# Patient Record
Sex: Female | Born: 1965 | Race: White | Hispanic: No | Marital: Single | Smoking: Never smoker
Health system: Southern US, Community
[De-identification: ages and names within clinical notes are randomized; demographics above are authoritative.]

## PROBLEM LIST (undated history)

## (undated) DIAGNOSIS — K625 Hemorrhage of anus and rectum: Secondary | ICD-10-CM

## (undated) DIAGNOSIS — F419 Anxiety disorder, unspecified: Secondary | ICD-10-CM

## (undated) DIAGNOSIS — E041 Nontoxic single thyroid nodule: Secondary | ICD-10-CM

## (undated) DIAGNOSIS — Z8711 Personal history of peptic ulcer disease: Secondary | ICD-10-CM

## (undated) DIAGNOSIS — M545 Low back pain, unspecified: Secondary | ICD-10-CM

## (undated) DIAGNOSIS — Z8742 Personal history of other diseases of the female genital tract: Secondary | ICD-10-CM

## (undated) DIAGNOSIS — E785 Hyperlipidemia, unspecified: Secondary | ICD-10-CM

## (undated) DIAGNOSIS — B019 Varicella without complication: Secondary | ICD-10-CM

## (undated) HISTORY — DX: Low back pain: M54.5

## (undated) HISTORY — DX: Anxiety disorder, unspecified: F41.9

## (undated) HISTORY — DX: Personal history of peptic ulcer disease: Z87.11

## (undated) HISTORY — DX: Hemorrhage of anus and rectum: K62.5

## (undated) HISTORY — DX: Nontoxic single thyroid nodule: E04.1

## (undated) HISTORY — DX: Varicella without complication: B01.9

## (undated) HISTORY — DX: Hyperlipidemia, unspecified: E78.5

## (undated) HISTORY — PX: OTHER SURGICAL HISTORY: SHX169

## (undated) HISTORY — DX: Low back pain, unspecified: M54.50

## (undated) HISTORY — DX: Personal history of other diseases of the female genital tract: Z87.42

---

## 2013-06-26 LAB — LIPID PANEL: LDL CALC: 142 mg/dL

## 2013-07-03 LAB — LIPID PANEL
CHOLESTEROL: 214 mg/dL — AB (ref 0–200)
HDL: 53 mg/dL (ref 35–70)
Triglycerides: 97 mg/dL (ref 40–160)

## 2013-07-03 LAB — CBC AND DIFFERENTIAL
HEMATOCRIT: 46 % (ref 36–46)
Hemoglobin: 15.1 g/dL (ref 12.0–16.0)
NEUTROS ABS: 54 /uL
Platelets: 313 10*3/uL (ref 150–399)
WBC: 6.6 10*3/mL

## 2013-07-03 LAB — HEMOGLOBIN A1C: Hemoglobin A1C: 5.5

## 2013-07-03 LAB — TSH: TSH: 2.18 u[IU]/mL (ref <=5.90)

## 2014-12-24 LAB — HM COLONOSCOPY

## 2015-07-12 LAB — HM MAMMOGRAPHY

## 2016-03-30 ENCOUNTER — Ambulatory Visit (INDEPENDENT_AMBULATORY_CARE_PROVIDER_SITE_OTHER): Payer: Commercial Managed Care - PPO | Admitting: Family Medicine

## 2016-03-30 ENCOUNTER — Encounter: Payer: Self-pay | Admitting: Family Medicine

## 2016-03-30 VITALS — BP 124/90 | HR 92 | Temp 98.9°F | Wt 192.6 lb

## 2016-03-30 DIAGNOSIS — J0110 Acute frontal sinusitis, unspecified: Secondary | ICD-10-CM | POA: Diagnosis not present

## 2016-03-30 DIAGNOSIS — E041 Nontoxic single thyroid nodule: Secondary | ICD-10-CM | POA: Diagnosis not present

## 2016-03-30 HISTORY — DX: Nontoxic single thyroid nodule: E04.1

## 2016-03-30 MED ORDER — BENZONATATE 100 MG PO CAPS: 100.0000 mg | ORAL_CAPSULE | Freq: Two times a day (BID) | ORAL | 0 refills | Status: DC | PRN

## 2016-03-30 MED ORDER — AMOXICILLIN-POT CLAVULANATE 875-125 MG PO TABS: 1.0000 | ORAL_TABLET | Freq: Two times a day (BID) | ORAL | 0 refills | Status: DC

## 2016-03-30 NOTE — Progress Notes (Signed)
Pre visit review using our clinic review tool, if applicable. No additional management support is needed unless otherwise documented below in the visit note. 

## 2016-03-30 NOTE — Patient Instructions (Addendum)
Sinsusitis Viral based on <10 days, no double sickening (symptoms get beter than worsen), lack of severity of symptoms in first 3 days. Educated on signs that bacterial infection may have developed (symptoms over 10 days, double sickening).   As we will be going into a long holiday weekend- we opted to provide augmentin in case [name] is not at least 50% better by Friday or Saturday. Also can use tessalon for cough. Noted recent foreign travel.   We reviewed reasons to return to care including if symptoms worsen or persist or new concerns arise (particularly fever or shortness of breath)  Bottom blood pressure hair high- will monitor- any history?  Sign release of information at the check out desk for records for last 5 years from your prior primary care doctor  ______________________________________________________________________  Starting October 1st 2018, I will be transferring to our new location: [place] [address]- across [place] from [employer] [employer] [place]) [place] [id number] Phone: [phone]  I would love to have you remain my patient at this new location as long as it remains convenient for you. I am excited about the opportunity to have x-ray and sports medicine in the new building but will really miss the awesome staff and physicians at [facility]. Continue to schedule appointments at [facility] and we will automatically transfer them to the [facility] [place] [facility] location starting October 1st.    __________________________________________________________________      WE NOW OFFER    [facility] FAST TRACK!!!  SAME DAY Appointments for ACUTE CARE  Such as: Sprains, Injuries, cuts, abrasions, rashes, muscle pain, joint pain, back pain Colds, flu, sore throats, headache, allergies, cough, fever  Ear pain, sinus and eye infections Abdominal pain, nausea, vomiting, diarrhea, upset  stomach Animal/insect bites  3 Easy Ways to Schedule: Walk-In Scheduling Call in scheduling Mychart Sign-up: [email or web address]

## 2016-03-30 NOTE — Progress Notes (Signed)
Phone: [phone]  Subjective:  Patient presents today to establish care.  Prior patient in [place]. Chief complaint-noted.   See problem oriented charting  The following were reviewed and entered/updated in epic: Past Medical History:  Diagnosis Date  . Chicken pox   . Thyroid nodule 03/30/2016   Need to get records. Gets scan every other year   Patient Active Problem List   Diagnosis Date Noted  . Thyroid nodule 03/30/2016   Past Surgical History:  Procedure Laterality Date  . none      Family History  Problem Relation Age of Onset  . Lung cancer Mother     smoker  . CAD Father     cabg x4 at age 53  . Hyperlipidemia Father   . Healthy Brother   . Breast cancer Maternal Aunt     Medications- reviewed and updated Current Outpatient Prescriptions  Medication Sig Dispense Refill  . amoxicillin-clavulanate (AUGMENTIN) 875-125 MG tablet Take 1 tablet by mouth 2 (two) times daily. 14 tablet 0  . benzonatate (TESSALON) 100 MG capsule Take 1 capsule (100 mg total) by mouth 2 (two) times daily as needed for cough. 20 capsule 0   No current facility-administered medications for this visit.     Allergies-reviewed and updated Allergies not on file  Social History   Social History  . Marital status: Single    Spouse name: N/A  . Number of children: N/A  . Years of education: N/A   Social History Main Topics  . Smoking status: Never Smoker  . Smokeless tobacco: Never Used  . Alcohol use 0.0 - 0.6 oz/week     Comment: rare social  . Drug use: No  . Sexual activity: Not Asked   Other Topics Concern  . None   Social History Narrative   Single. Lives alone. 1 dog- boxer. No children.       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       Hobbies: tennis, travel ([place]ach, [place], [place], [place]    ROS--Full ROS was completed Review of Systems  Constitutional: Positive for malaise/fatigue. Negative for chills and fever.  HENT:  Positive for congestion and sinus pain. Negative for ear pain, hearing loss and tinnitus.   Eyes: Negative for blurred vision and double vision.  Respiratory: Positive for cough and sputum production. Negative for hemoptysis, shortness of breath and wheezing.   Cardiovascular: Negative for chest pain and palpitations.  Gastrointestinal: Negative for heartburn and nausea.  Genitourinary: Negative for dysuria and urgency.  Musculoskeletal: Negative for myalgias and neck pain.  Skin: Negative for itching and rash.  Neurological: Negative for dizziness and headaches.  Endo/Heme/Allergies: Negative for polydipsia. Does not bruise/bleed easily.  Psychiatric/Behavioral: Negative for hallucinations and substance abuse.   Objective: BP 124/90 (BP Location: Left Arm, Patient Position: Sitting, Cuff Size: Normal)   Pulse 92   Temp 98.9 F (37.2 C) (Oral)   Wt 192 lb 9.6 oz (87.4 kg)   SpO2 97%  Gen: NAD, resting comfortably, intermittent cough HEENT: Turbinates erythematous with yellow drainage, TM normal, pharynx mildly erythematous with no tonsilar exudate or edema, maxillary bilateral sinus tenderness, frontal sinus tenderness more severe Eyes: sclera and lids normal, PERRLA Neck:  no cervical lymphadenopathy CV: RRR no murmurs rubs or gallops Lungs: CTAB no crackles, wheeze, rhonchi Abdomen: soft/nontender/nondistended/normal bowel sounds. No rebound or guarding.  Ext: no edema Skin: warm, dry Neuro: 5/5 strength in upper and lower extremities, normal gait, normal reflexes   Assessment/Plan:  Cough, congestion, sinus pressure S: was in [place] last week and started with cough and runny nose, sneezing. No fever or shortness of breath. Does have sinus tenderness particularly frontal area. Mild sore throat. Day 6 today. Has taken nyquil liquid gels and not sure if helping other than nose drying up. Was in [place] from Monday to Saturday of last week so sick in middle of this. There were some MERS  warnings. Did used to get sinus infections before she moved.  A/P: Sinsusitis Viral based on <10 days, no double sickening (symptoms get beter than worsen), lack of severity of symptoms in first 3 days. Educated on signs that bacterial infection may have developed (symptoms over 10 days, double sickening).   As we will be going into a long holiday weekend- we opted to provide augmentin in case Ms. Lunden is not at least 50% better by Friday or Saturday. Also can use tessalon for cough. Noted recent foreign travel.   We reviewed reasons to return to care including if symptoms worsen or persist or new concerns arise (particularly fever or shortness of breath)  Thyroid nodule S: history of thyroid nodule monitored q 2 years A/P: will get records and determine next follow up   Meds ordered this encounter  Medications  . benzonatate (TESSALON) 100 MG capsule    Sig: Take 1 capsule (100 mg total) by mouth 2 (two) times daily as needed for cough.    Dispense:  20 capsule    Refill:  0  . amoxicillin-clavulanate (AUGMENTIN) 875-125 MG tablet    Sig: Take 1 tablet by mouth 2 (two) times daily.    Dispense:  14 tablet    Refill:  0   Return precautions advised. Tana ConchStephen Hunter, MD

## 2016-04-17 ENCOUNTER — Encounter: Payer: Self-pay | Admitting: Family Medicine

## 2016-04-17 DIAGNOSIS — J302 Other seasonal allergic rhinitis: Secondary | ICD-10-CM | POA: Insufficient documentation

## 2016-04-17 DIAGNOSIS — E041 Nontoxic single thyroid nodule: Secondary | ICD-10-CM

## 2016-04-17 DIAGNOSIS — J301 Allergic rhinitis due to pollen: Secondary | ICD-10-CM

## 2016-04-17 NOTE — Progress Notes (Unsigned)
Colonoscopy was noted December 2016- internal hemorrhoids only- will be abstracted  Constipation issues in the past- miralax to linzess potential change.   Xanax in the past for flying  2013 abnormal menstrual periods- will need to discuss if had end resolution- last notes from 2012.   Abstract labs 07/21/15. hgb hair high at 15.1a1c 5.5LDL 142  Mammogram 07/12/15 reproted as normal. Unclear- may need to request clearer copy from [facility] health  Low back pain with MRI 07/10/14 scanned in. Mild degenerative changes  [place] report scanned in

## 2016-04-17 NOTE — Assessment & Plan Note (Addendum)
Records reviewed. Patient with u/s dec 2012 showing multinodular goiter. Repeat 06/2013 stable subcentimeter nodules. Repeat 07/12/15 "normal thyroid gland with small colloid nodules" size 3.3 cm right lobe and 2.5 cm lefft lobe. "several cystic nodules all less than 6 mm"

## 2016-04-29 ENCOUNTER — Encounter: Payer: Self-pay | Admitting: Family Medicine

## 2016-05-28 ENCOUNTER — Encounter: Payer: Self-pay | Admitting: Family Medicine

## 2016-06-04 ENCOUNTER — Ambulatory Visit (INDEPENDENT_AMBULATORY_CARE_PROVIDER_SITE_OTHER)
Admission: RE | Admit: 2016-06-04 | Discharge: 2016-06-04 | Disposition: A | Discharge status: Home / Self Care | Payer: Commercial Managed Care - PPO | Source: Ambulatory Visit | Attending: Family Medicine | Admitting: Family Medicine

## 2016-06-04 ENCOUNTER — Ambulatory Visit (INDEPENDENT_AMBULATORY_CARE_PROVIDER_SITE_OTHER): Payer: Commercial Managed Care - PPO | Admitting: Family Medicine

## 2016-06-04 ENCOUNTER — Encounter: Payer: Self-pay | Admitting: Family Medicine

## 2016-06-04 VITALS — BP 100/68 | HR 88 | Temp 98.4°F | Wt 202.0 lb

## 2016-06-04 DIAGNOSIS — M79675 Pain in left toe(s): Secondary | ICD-10-CM

## 2016-06-04 NOTE — Patient Instructions (Addendum)
It was a pleasure to see you today.  Please go to [facility] - located [address] across the street from [place] - in the basement - Hours: 8:30-5:30 PM M-F. Do not need appointment.    Toe Fracture A toe fracture is a break in one of the toe bones (phalanges). Follow these instructions at home: If you have a cast:  Do not stick anything inside the cast to scratch your skin.  Check the skin around the cast every day. Tell your doctor about any concerns. Do not put lotion on the skin underneath the cast. You may put lotion on dry skin around the edges of the cast.  Do not put pressure on any part of the cast until it is fully hardened. This may take many hours.  Keep the cast clean and dry. Bathing  Do not take baths, swim, or use a hot tub until your doctor says that you can. Ask your doctor if you can take showers. You may only be allowed to take sponge baths for bathing.  If your doctor says that bathing and showering are okay, cover the cast or bandage (dressing) with a watertight plastic bag to protect it from water. Do not let the cast or bandage get wet. Managing pain, stiffness, and swelling  If you do not have a cast, put ice on the injured area if told by your doctor: ? Put ice in a plastic bag. ? Place a towel between your skin and the bag. ? Leave the ice on for 20 minutes, 2-3 times per day.  Move your toes often to avoid stiffness and to lessen swelling.  Raise (elevate) the injured area above the level of your heart while you are sitting or lying down. Driving  Do not drive or use heavy machinery while taking pain medicine.  Do not drive while wearing a cast on a foot that you use for driving. Activity  Return to your normal activities as told by your doctor. Ask your doctor what activities are safe for you.  Perform exercises daily as told by your doctor or therapist. Safety  Do not use your leg to support your body weight until your doctor  says that you can. Use crutches or other tools to help you move around as told by your doctor. General instructions  If your toe was taped to a toe that is next to it (buddy taping), follow your doctor's instructions for changing the gauze and tape. Change it more often: ? If the gauze and tape get wet. If this happens, dry the space between the toes. ? If the gauze and tape are too tight and they cause your toe to become pale or to lose feeling (numb).  Wear a protective shoe as told by your doctor. If you were not given one, wear sturdy shoes that support your foot. Your shoes should not pinch your toes. Your shoes should not fit tightly against your toes.  Do not use any tobacco products, including cigarettes, chewing tobacco, or e-cigarettes. Tobacco can delay bone healing. If you need help quitting, ask your doctor.  Take medicines only as told by your doctor.  Keep all follow-up visits as told by your doctor. This is important. Contact a doctor if:  You have a fever.  Your pain medicine is not helping.  Your toe feels cold.  You lose feeling (have numbness) in your toe.  You still have pain after one week of rest and treatment.  You still have pain after your doctor has said that you can start walking again.  You have pain or tingling in your foot, and it is not going away.  You have loss of feeling in your foot, and it is not going away. Get help right away if:  You have severe pain.  You have redness or swelling (inflammation) in your toe, and it is getting worse.  You have pain or loss of feeling in your toe, and it is getting worse.  Your toe is blue. This information is not intended to replace advice given to you by your health care provider. Make sure you discuss any questions you have with your health care provider. Document Released: 06/10/2007 Document Revised: 08/26/2015 Document Reviewed: 10/18/2013 Elsevier Interactive Patient Education  2018 Elsevier  Inc.     WE NOW OFFER   [facility] FAST TRACK!!!  SAME DAY Appointments for ACUTE CARE  Such as: Sprains, Injuries, cuts, abrasions, rashes, muscle pain, joint pain, back pain Colds, flu, sore throats, headache, allergies, cough, fever  Ear pain, sinus and eye infections Abdominal pain, nausea, vomiting, diarrhea, upset stomach Animal/insect bites  3 Easy Ways to Schedule: Walk-In Scheduling Call in scheduling Mychart Sign-up: [email or web address]

## 2016-06-04 NOTE — Progress Notes (Signed)
Subjective:    Patient ID: Pamela Rice, female    DOB: [birth year], 51 y.o.   MRN: [id number]  HPI  Pamela Rice is a 51 year old female who presents today with pain of her 4th left toe that started 2 weeks ago after a trigger of "catching" her toe on the pantry door and she heard a "pop".  After injury occurred, associated ecchymosis and swelling were noted. Ecchymosis has resolved, mild swelling remains. Treatment with ice for 15 minutes and elevation once daily has provided limited benefit. She denies other treatment and states that pain is noted only with lateral movement. Pain is described a aching with lateral movement, however she has not used any OTC pain medications stating she does not need it now. She denies difficulty walking and has been wearing flat shoes where she notices swelling every evening. No associated numbness, tingling, or weakness.   Review of Systems  Constitutional: Negative for chills, fatigue and fever.  Respiratory: Negative for cough, shortness of breath and wheezing.   Cardiovascular: Negative for chest pain and palpitations.  Musculoskeletal:       Left 4th toe pain and swelling  Skin: Negative for color change and rash.   Past Medical History:  Diagnosis Date  . Chicken pox   . Low back pain    saw [place]o in [place] 2016  . Thyroid nodule 03/30/2016   Need to get records. Gets scan every other year     Social History   Social History  . Marital status: Single    Spouse name: N/A  . Number of children: N/A  . Years of education: N/A   Occupational History  . Not on file.   Social History Main Topics  . Smoking status: Never Smoker  . Smokeless tobacco: Never Used  . Alcohol use 0.0 - 0.6 oz/week     Comment: rare social  . Drug use: No  . Sexual activity: Not on file   Other Topics Concern  . Not on file   Social History Narrative   Single. Lives alone. 1 dog- boxer. No children.       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       Hobbies: tennis, travel ([place], [place], [place], [place]    Past Surgical History:  Procedure Laterality Date  . none      Family History  Problem Relation Age of Onset  . Lung cancer Mother        smoker  . CAD Father        cabg x4 at age 50  . Hyperlipidemia Father   . Healthy Brother   . Breast cancer Maternal Aunt     Allergies  Allergen Reactions  . Yeast-Related Products     No current outpatient prescriptions on file prior to visit.   No current facility-administered medications on file prior to visit.     BP 100/68 (BP Location: Left Arm, Patient Position: Sitting, Cuff Size: Large)   Pulse 88   Temp 98.4 F (36.9 C) (Oral)   Wt 202 lb (91.6 kg)   SpO2 98%        Objective:   Physical Exam  Constitutional: She is oriented to person, place, and time. She appears well-developed and well-nourished.  Eyes: Pupils are equal, round, and reactive to light. No scleral icterus.  Neck: Neck supple.  Cardiovascular: Normal rate, regular rhythm and intact distal pulses.   Pulses:      Dorsalis  pedis pulses are 2+ on the right side, and 2+ on the left side.       Posterior tibial pulses are 2+ on the right side, and 2+ on the left side.  Pulmonary/Chest: Effort normal and breath sounds normal. She has no wheezes. She has no rales.  Musculoskeletal:  Minimal edema noted on left 4th toe. No erythema or ecchymosis present. Discomfort with lateral movement. No deformity, disruption of nail bed, or point tenderness. Cap refill WNL,   Lymphadenopathy:    She has no cervical adenopathy.  Neurological: She is alert and oriented to person, place, and time.  Skin: Skin is warm and dry. No rash noted.       Assessment & Plan:  1. Toe pain, left Improving per patient; Suspect possible fracture due to history of injury; will obtain X-ray today; buddy tape toe and she can use either ibuprofen or acetaminophen for discomfort as needed. X-ray results  will determine follow up. Advised use of rigid soled shoe; she declined surgical shoe today. Written return precautions provided.  - DG Toe 4th Left; Future

## 2016-08-10 ENCOUNTER — Encounter: Payer: Self-pay | Admitting: Family Medicine

## 2016-08-10 DIAGNOSIS — E785 Hyperlipidemia, unspecified: Secondary | ICD-10-CM | POA: Insufficient documentation

## 2016-08-10 DIAGNOSIS — F419 Anxiety disorder, unspecified: Secondary | ICD-10-CM | POA: Insufficient documentation

## 2016-08-10 DIAGNOSIS — K625 Hemorrhage of anus and rectum: Secondary | ICD-10-CM | POA: Insufficient documentation

## 2016-08-10 DIAGNOSIS — M545 Low back pain, unspecified: Secondary | ICD-10-CM | POA: Insufficient documentation

## 2016-08-10 DIAGNOSIS — Z8742 Personal history of other diseases of the female genital tract: Secondary | ICD-10-CM | POA: Insufficient documentation

## 2016-08-15 ENCOUNTER — Telehealth: Payer: Self-pay | Admitting: Family Medicine

## 2016-08-15 NOTE — Telephone Encounter (Signed)
I have reviewed records  Appears she is due for evaluation of thyroid- lets have her schedule follow up with me so we can get a plan together.   I see where she saw GYn 12/2015 but no evidence of pap- can she request that pap report specifically please?

## 2016-08-17 NOTE — Telephone Encounter (Signed)
Scheduled patient for a physical on 09/03/16

## 2016-09-03 ENCOUNTER — Ambulatory Visit (INDEPENDENT_AMBULATORY_CARE_PROVIDER_SITE_OTHER): Payer: Commercial Managed Care - PPO | Admitting: Family Medicine

## 2016-09-03 ENCOUNTER — Encounter: Payer: Self-pay | Admitting: Family Medicine

## 2016-09-03 VITALS — BP 118/76 | HR 97 | Temp 98.1°F | Ht 64.5 in | Wt 204.2 lb

## 2016-09-03 DIAGNOSIS — Z0000 Encounter for general adult medical examination without abnormal findings: Secondary | ICD-10-CM

## 2016-09-03 DIAGNOSIS — E041 Nontoxic single thyroid nodule: Secondary | ICD-10-CM

## 2016-09-03 DIAGNOSIS — H8110 Benign paroxysmal vertigo, unspecified ear: Secondary | ICD-10-CM | POA: Diagnosis not present

## 2016-09-03 DIAGNOSIS — F419 Anxiety disorder, unspecified: Secondary | ICD-10-CM

## 2016-09-03 DIAGNOSIS — E785 Hyperlipidemia, unspecified: Secondary | ICD-10-CM

## 2016-09-03 DIAGNOSIS — M545 Low back pain: Secondary | ICD-10-CM | POA: Diagnosis not present

## 2016-09-03 DIAGNOSIS — Z23 Encounter for immunization: Secondary | ICD-10-CM | POA: Diagnosis not present

## 2016-09-03 MED ORDER — CYCLOBENZAPRINE HCL 10 MG PO TABS: 10.0000 mg | ORAL_TABLET | Freq: Two times a day (BID) | ORAL | 3 refills | Status: AC | PRN

## 2016-09-03 MED ORDER — MECLIZINE HCL 12.5 MG PO TABS: 12.5000 mg | ORAL_TABLET | Freq: Three times a day (TID) | ORAL | 2 refills | Status: AC | PRN

## 2016-09-03 MED ORDER — ALPRAZOLAM 0.25 MG PO TABS: 0.2500 mg | ORAL_TABLET | Freq: Every day | ORAL | 0 refills | Status: AC | PRN

## 2016-09-03 NOTE — Assessment & Plan Note (Signed)
Gets scan every other year. Records show 07/11/15 due for ultrasound and check TSH and T3. No thyroid ultrasound in report  - therefore we will get ultrasound, t3, t4, TSH today.

## 2016-09-03 NOTE — Addendum Note (Signed)
Addended by: Rivka SaferSOUTHERN HIZER, Asher MuirJAMIE M on: 09/03/2016 02:30 PM   Modules accepted: Orders

## 2016-09-03 NOTE — Assessment & Plan Note (Signed)
HLD- last #s from 2015, reviewed with patient and will update Lab Results  Component Value Date   CHOL 214 (A) 07/03/2013   HDL 53 07/03/2013   LDLCALC 142 06/26/2013   TRIG 97 07/03/2013

## 2016-09-03 NOTE — Assessment & Plan Note (Signed)
prior rx for xanax 2014 per prior PCP. Plane flights mainly. #10 xanax given for plane flights only

## 2016-09-03 NOTE — Patient Instructions (Addendum)
Tdap today  Sign release of information at the check out desk for pap smear from gynecology  Schedule a lab visit at the check out desk within 2 weeks. Return for future fasting labs meaning nothing but water after midnight please. Ok to take your medications with water.   We will call you within a week or two about your referral for ultrasound of thyroid. If you do not hear within 3 weeks, give us a call.

## 2016-09-03 NOTE — Assessment & Plan Note (Signed)
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 Asks for refill of meclizine- sent in for her.

## 2016-09-03 NOTE — Assessment & Plan Note (Signed)
MR 07/2014 mild degenerative disc bulge L4-5 and L5-SQ. facet arthropathy. celebrex prn in past.   States uses flexeril prn only at present- will refill

## 2016-09-03 NOTE — Progress Notes (Addendum)
Phone: [phone]  Subjective:  Patient presents today for their annual physical. Chief complaint-noted.   See problem oriented charting- ROS- full  review of systems was completed and negative except for: back pain at times- flexeril helps  The following were reviewed and entered/updated in epic: Past Medical History:  Diagnosis Date  . Anxiety    prior rx for xanax 2014 per prior PCP  . Chicken pox   . History of abnormal cervical Pap smear    Notes state history abnormal with + HPV but checked every 6 months until clear including HPV. Normal Pap and pelvic 12/2010.   Marland Kitchen History of peptic ulcer    also hiatal hernia/gastric polyp. EGD 2011- improved with improved diet, uctting soda, prn PPI  . Hyperlipidemia    no rx. from records  . Low back pain    saw ortho. MR 07/2014 mild degenerative disc bulge L4-5 and L5-SQ. facet arthropathy. celebrex prn  . Rectal bleeding    hemorrhoids. colonoscopy 12/2014 in pcp notes. advised miralax  . Thyroid nodule 03/30/2016   Need to get records. Gets scan every other year   Patient Active Problem List   Diagnosis Date Noted  . Anxiety     Priority: Medium  . Hyperlipidemia     Priority: Medium  . Low back pain     Priority: Medium  . Thyroid nodule 03/30/2016    Priority: Medium  . BPPV (benign paroxysmal positional vertigo) 09/03/2016    Priority: Low  . History of abnormal cervical Pap smear     Priority: Low  . Rectal bleeding     Priority: Low  . Seasonal allergies 04/17/2016    Priority: Low   Past Surgical History:  Procedure Laterality Date  . none      Family History  Problem Relation Age of Onset  . Lung cancer Mother        smoker  . CAD Father        cabg x4 at age 15  . Hyperlipidemia Father   . Healthy Brother   . Breast cancer Maternal Aunt     Medications- reviewed and updated Current Outpatient Prescriptions  Medication Sig Dispense Refill  . ALPRAZolam (XANAX) 0.25 MG tablet Take 1 tablet (0.25  mg total) by mouth daily as needed (flight anxiety). 10 tablet 0  . cyclobenzaprine (FLEXERIL) 10 MG tablet Take 1 tablet (10 mg total) by mouth 2 (two) times daily as needed for muscle spasms. 30 tablet 3  . meclizine (ANTIVERT) 12.5 MG tablet Take 1-2 tablets (12.5-25 mg total) by mouth 3 (three) times daily as needed for dizziness. 30 tablet 2   No current facility-administered medications for this visit.     Allergies-reviewed and updated Allergies  Allergen Reactions  . Yeast-Related Products     Social History   Social History  . Marital status: Single    Spouse name: N/A  . Number of children: N/A  . Years of education: N/A   Social History Main Topics  . Smoking status: Never Smoker  . Smokeless tobacco: Never Used  . Alcohol use 0.0 - 0.6 oz/week     Comment: rare social  . Drug use: No  . Sexual activity: Not Asked   Other Topics Concern  . None   Social History Narrative   Single. Lives alone. 1 dog- boxer. No children.       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       Hobbies: tennis, travel (  [place], [place], [place])    Objective: BP 118/76 (BP Location: Left Arm, Patient Position: Sitting, Cuff Size: Large)   Pulse 97   Temp 98.1 F (36.7 C) (Oral)   Ht 5' 4.5" (1.638 m)   Wt 204 lb 3.2 oz (92.6 kg)   SpO2 99%   BMI 34.51 kg/m  Gen: NAD, resting comfortably HEENT: Mucous membranes are moist. Oropharynx normal Neck: no thyromegaly CV: RRR no murmurs rubs or gallops Lungs: CTAB no crackles, wheeze, rhonchi Abdomen: soft/nontender/nondistended/normal bowel sounds. No rebound or guarding.  Ext: no edema Skin: warm, dry Neuro: grossly normal, moves all extremities, PERRLA  Assessment/Plan:  51 y.o. female presenting for annual physical.  Health Maintenance counseling: 1. Anticipatory guidance: Patient counseled regarding regular dental exams -goes to dentist at home, eye exams - still doing in [place], wearing  seatbelts.  2. Risk factor reduction:  Advised patient of need for regular exercise and diet rich and fruits and vegetables to reduce risk of heart attack and stroke. Exercise- getting ready to start again on exercise- had thrown her back out and trying to recover. Diet-focusing on more chicken than red meat. 1 serving of veggie per day, 0-1 of fruit. Almost nightly ice cream- try smaller scoop.   Wt Readings from Last 3 Encounters:  09/03/16 204 lb 3.2 oz (92.6 kg)  06/04/16 202 lb (91.6 kg)  03/30/16 192 lb 9.6 oz (87.4 kg)  3. Immunizations/screenings/ancillary studies- offered flu shot and declined. Tdap today offered and given.hold off on shingrix untial available. Declines HIV 4. Cervical cancer screening-  no record of pap. Saw GYN in [place] Dr. Elmore Guise in December- will get records. Abnormal pap smear in the past with + HPV- now on yearly schedule after clearance 5. Breast cancer screening-  breast exam with GYN and mammogram 07/12/15.  6. Colon cancer screening -  12/24/14 with 10 year follow up. Early due to rectal bleeding- no polyps on exam. 7. Skin cancer screening- [place] dermatology Dr. Charm Barges.   Status of chronic or acute concerns   Seasonal allergies- flonase and mucinex prn. Better in [place]  BPPV (benign paroxysmal positional vertigo) Bouts of vertigo thought BPPV in past. Asks for refill of meclizine- sent in for her.   Thyroid nodule Gets scan every other year. Records show 07/11/15 due for ultrasound and check TSH and T3. No thyroid ultrasound in report  - therefore we will get ultrasound, t3, t4, TSH today.   Hyperlipidemia HLD- last #s from 2015, reviewed with patient and will update Lab Results  Component Value Date   CHOL 214 (A) 07/03/2013   HDL 53 07/03/2013   LDLCALC 142 06/26/2013   TRIG 97 07/03/2013     Low back pain MR 07/2014 mild degenerative disc bulge L4-5 and L5-SQ. facet arthropathy. celebrex prn in past.   States uses flexeril prn  only at present- will refill  Anxiety prior rx for xanax 2014 per prior PCP. Plane flights mainly. #10 xanax given for plane flights only   No future appointments. No Follow-up on file.  Orders Placed This Encounter  Procedures  . US THYROID    Standing Status:   Future    Standing Expiration Date:   11/03/2017    Order Specific Question:   Reason for Exam (SYMPTOM  OR DIAGNOSIS REQUIRED)    Answer:   thyroid    Order Specific Question:   Preferred imaging location?    Answer:   [facility]  . CBC  [facility]    Standing Status:   Future    Standing Expiration Date:   09/03/2017  . Comprehensive metabolic panel    [facility]    Standing Status:   Future    Standing Expiration Date:   09/03/2017    Order Specific Question:   Has the patient fasted?    Answer:   No  . Lipid panel    [facility]    Standing Status:   Future    Standing Expiration Date:   09/03/2017    Order Specific Question:   Has the patient fasted?    Answer:   No  . TSH    [facility]    Standing Status:   Future    Standing Expiration Date:   09/03/2017  . T4, free    [facility]    Standing Status:   Future    Standing Expiration Date:   09/03/2017  . T3, free    Standing Status:   Future    Standing Expiration Date:   09/03/2017    Meds ordered this encounter  Medications  . cyclobenzaprine (FLEXERIL) 10 MG tablet    Sig: Take 1 tablet (10 mg total) by mouth 2 (two) times daily as needed for muscle spasms.    Dispense:  30 tablet    Refill:  3  . ALPRAZolam (XANAX) 0.25 MG tablet    Sig: Take 1 tablet (0.25 mg total) by mouth daily as needed (flight anxiety).    Dispense:  10 tablet    Refill:  0  . meclizine (ANTIVERT) 12.5 MG tablet    Sig: Take 1-2 tablets (12.5-25 mg total) by mouth 3 (three) times daily as needed for dizziness.    Dispense:  30 tablet    Refill:  2    Return precautions advised.  Tana ConchStephen Shailee Foots, MD

## 2016-09-04 ENCOUNTER — Other Ambulatory Visit (INDEPENDENT_AMBULATORY_CARE_PROVIDER_SITE_OTHER): Payer: Commercial Managed Care - PPO

## 2016-09-04 DIAGNOSIS — E785 Hyperlipidemia, unspecified: Secondary | ICD-10-CM

## 2016-09-04 DIAGNOSIS — Z0000 Encounter for general adult medical examination without abnormal findings: Secondary | ICD-10-CM

## 2016-09-04 DIAGNOSIS — E041 Nontoxic single thyroid nodule: Secondary | ICD-10-CM | POA: Diagnosis not present

## 2016-09-04 LAB — CBC
HEMATOCRIT: 44.2 % (ref 36.0–46.0)
HEMOGLOBIN: 14.7 g/dL (ref 12.0–15.0)
MCHC: 33.2 g/dL (ref 30.0–36.0)
MCV: 87.9 fl (ref 78.0–100.0)
PLATELETS: 356 10*3/uL (ref 150.0–400.0)
RBC: 5.03 Mil/uL (ref 3.87–5.11)
RDW: 14.6 % (ref 11.5–15.5)
WBC: 5.6 10*3/uL (ref 4.0–10.5)

## 2016-09-04 LAB — COMPREHENSIVE METABOLIC PANEL
ALT: 17 U/L (ref 0–35)
AST: 12 U/L (ref 0–37)
Albumin: 4.3 g/dL (ref 3.5–5.2)
Alkaline Phosphatase: 80 U/L (ref 39–117)
BUN: 12 mg/dL (ref 6–23)
CHLORIDE: 105 meq/L (ref 96–112)
CO2: 24 meq/L (ref 19–32)
Calcium: 9.5 mg/dL (ref 8.4–10.5)
Creatinine, Ser: 0.67 mg/dL (ref 0.40–1.20)
GFR: 98.87 mL/min (ref >60.00)
GLUCOSE: 98 mg/dL (ref 70–99)
POTASSIUM: 4.4 meq/L (ref 3.5–5.1)
Sodium: 139 mEq/L (ref 135–145)
Total Bilirubin: 0.3 mg/dL (ref 0.2–1.2)
Total Protein: 6.6 g/dL (ref 6.0–8.3)

## 2016-09-04 LAB — LIPID PANEL
CHOL/HDL RATIO: 4
Cholesterol: 222 mg/dL — ABNORMAL HIGH (ref 0–200)
HDL: 54.5 mg/dL (ref >39.00)
LDL CALC: 153 mg/dL — AB (ref 0–99)
NONHDL: 167.9
Triglycerides: 75 mg/dL (ref 0.0–149.0)
VLDL: 15 mg/dL (ref 0.0–40.0)

## 2016-09-04 LAB — T4, FREE: Free T4: 0.94 ng/dL (ref 0.60–1.60)

## 2016-09-04 LAB — T3, FREE: T3, Free: 3.7 pg/mL (ref 2.3–4.2)

## 2016-09-04 LAB — TSH: TSH: 2.7 u[IU]/mL (ref 0.35–4.50)

## 2016-09-09 ENCOUNTER — Telehealth: Payer: Self-pay | Admitting: *Deleted

## 2016-09-09 NOTE — Telephone Encounter (Signed)
Pt notified of instructions and verbalized understanding. Would you recommend she wear compression stockings now to help w/ swelling, or wear them only for flight? Thanks.

## 2016-09-09 NOTE — Telephone Encounter (Signed)
Injury sounds like trauma related and unlikely clot has caused her symptoms. She can seek care locally if concerned. Good sign this is improving  If this is not clot related- having traumatic injury as long as not immobilized should not significantly increase her risk of DVT. I do not think she has to avoid travel.

## 2016-09-09 NOTE — Telephone Encounter (Signed)
Patient was on a segway tour abroad and fell and hit her shin.  She now has swelling and bruising of the affected extremity 'almost all the way around my whole leg' and extending to her foot.  She took ASA a 'few days' the week prior to her trip, last dose on Saturday. She has been elevating and icing the leg with improvement in swelling.   She is supposed to be flying home Saturday (7 hour flight) and is concerned that this increases her risk for blood clot.  She has compession stockings to wear for the flight and can also start wearing them now.  Advised patient to continue elevation and ice PRN and rest extremity.  Please advise if you think she is safe to travel and if she should wear compression stockings prior to flight.

## 2016-09-10 NOTE — Telephone Encounter (Signed)
Ice 3x a day for 20 minutes  Elevate when not on feet  When having to be on feet can use compression stockings before flight(also wear during flight)

## 2016-09-11 NOTE — Telephone Encounter (Signed)
Tries to call patient but a message stating the phone number was currently unavailable was given

## 2017-12-01 ENCOUNTER — Other Ambulatory Visit: Payer: Self-pay

## 2017-12-01 DIAGNOSIS — E041 Nontoxic single thyroid nodule: Secondary | ICD-10-CM

## 2018-09-13 IMAGING — DX DG TOE 4TH 2+V*L*
3 series · 3 of 3 positions shown · non-contrast
Comparison: None.

CLINICAL DATA: Left fourth toe pain and swelling after injury 2
weeks ago.

EXAM:
LEFT FOURTH TOE

[toe ap]
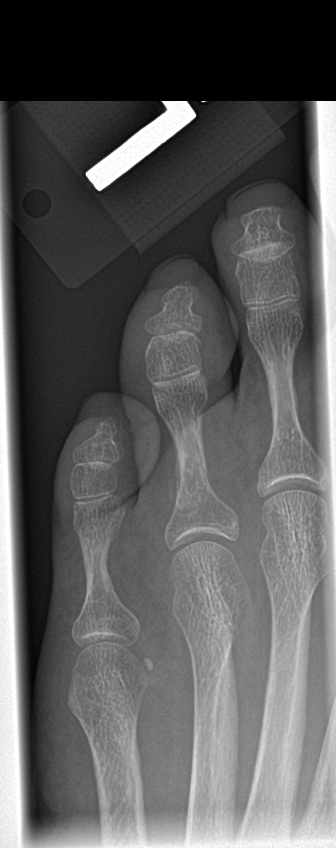

[toe obl]
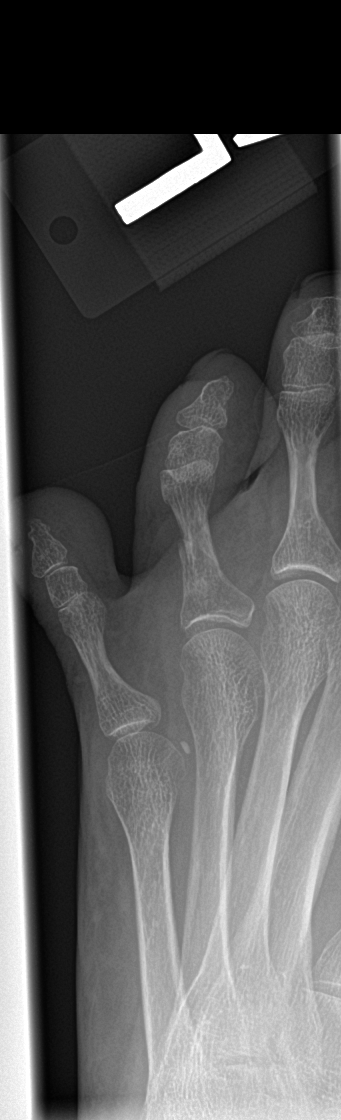

[toe lat]
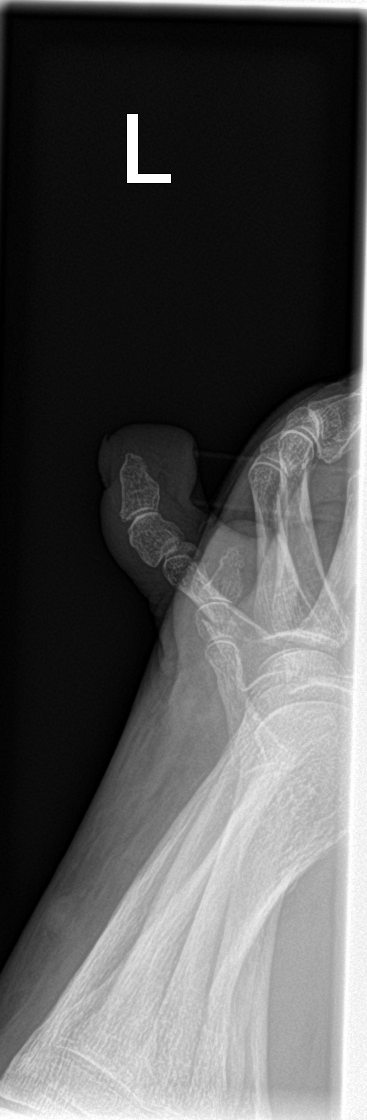

[3 of 3 positions shown; findings below may reference images not displayed]

FINDINGS: Minimally displaced oblique fracture is seen involving the fourth
proximal phalanx. Joint spaces are intact. No soft tissue
abnormality is noted.
IMPRESSION: Minimally displaced fourth proximal phalangeal fracture.
